# Patient Record
Sex: Male | Born: 2000 | Race: Black or African American | Hispanic: No | Marital: Single
Health system: Southern US, Community
[De-identification: ages and names within clinical notes are randomized; demographics above are authoritative.]

## PROBLEM LIST (undated history)

## (undated) DIAGNOSIS — H409 Unspecified glaucoma: Secondary | ICD-10-CM

## (undated) HISTORY — DX: Unspecified glaucoma: H40.9

## (undated) HISTORY — PX: EYE SURGERY: SHX253

---
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
## 2019-11-11 ENCOUNTER — Other Ambulatory Visit: Payer: Self-pay

## 2019-11-11 ENCOUNTER — Ambulatory Visit (INDEPENDENT_AMBULATORY_CARE_PROVIDER_SITE_OTHER): Payer: 59 | Admitting: Adult Health Nurse Practitioner

## 2019-11-11 ENCOUNTER — Encounter: Payer: Self-pay | Admitting: Adult Health Nurse Practitioner

## 2019-11-11 ENCOUNTER — Ambulatory Visit (INDEPENDENT_AMBULATORY_CARE_PROVIDER_SITE_OTHER): Payer: 59

## 2019-11-11 VITALS — BP 107/64 | HR 78 | Temp 98.2°F | Ht 67.0 in | Wt 143.0 lb

## 2019-11-11 DIAGNOSIS — R0689 Other abnormalities of breathing: Secondary | ICD-10-CM

## 2019-11-11 DIAGNOSIS — R000 Tachycardia, unspecified: Secondary | ICD-10-CM | POA: Insufficient documentation
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
## 2019-11-11 NOTE — Patient Instructions (Signed)
° ° ° °  If you have lab work done today you will be contacted with your lab results within the next 2 weeks.  If you have not heard from us then please contact us. The fastest way to get your results is to register for My Chart. ° ° °IF you received an x-ray today, you will receive an invoice from [facility]. Please contact [facility] at [phone] with questions or concerns regarding your invoice.  ° °IF you received labwork today, you will receive an invoice from [facility]. Please contact [facility] at [phone] with questions or concerns regarding your invoice.  ° °Our billing staff will not be able to assist you with questions regarding bills from these companies. ° °You will be contacted with the lab results as soon as they are available. The fastest way to get your results is to activate your My Chart account. Instructions are located on the last page of this paperwork. If you have not heard from us regarding the results in 2 weeks, please contact this office. °  ° ° ° °

## 2019-11-12 LAB — CMP14+EGFR
ALT: 11 IU/L (ref 0–44)
AST: 16 IU/L (ref 0–40)
Albumin/Globulin Ratio: 1.8 (ref 1.2–2.2)
Albumin: 4.4 g/dL (ref 4.1–5.2)
Alkaline Phosphatase: 97 IU/L (ref 39–117)
BUN/Creatinine Ratio: 6 — ABNORMAL LOW (ref 9–20)
BUN: 7 mg/dL (ref 6–20)
Bilirubin Total: 0.3 mg/dL (ref 0.0–1.2)
CO2: 25 mmol/L (ref 20–29)
Calcium: 9.1 mg/dL (ref 8.7–10.2)
Chloride: 105 mmol/L (ref 96–106)
Creatinine, Ser: 1.08 mg/dL (ref 0.76–1.27)
GFR calc Af Amer: 114 mL/min/{1.73_m2} (ref >59)
GFR calc non Af Amer: 98 mL/min/{1.73_m2} (ref >59)
Globulin, Total: 2.4 g/dL (ref 1.5–4.5)
Glucose: 86 mg/dL (ref 65–99)
Potassium: 4.1 mmol/L (ref 3.5–5.2)
Sodium: 143 mmol/L (ref 134–144)
Total Protein: 6.8 g/dL (ref 6.0–8.5)

## 2019-11-12 LAB — THYROID PANEL WITH TSH
Free Thyroxine Index: 1.9 (ref 1.2–4.9)
T3 Uptake Ratio: 31 % (ref 24–39)
T4, Total: 6.2 ug/dL (ref 4.5–12.0)
TSH: 2.85 u[IU]/mL (ref 0.450–4.500)

## 2019-11-25 ENCOUNTER — Encounter: Payer: 59 | Admitting: Adult Health Nurse Practitioner

## 2019-12-01 ENCOUNTER — Encounter: Payer: Self-pay | Admitting: Adult Health Nurse Practitioner

## 2020-03-13 ENCOUNTER — Other Ambulatory Visit: Payer: Self-pay

## 2020-03-13 ENCOUNTER — Encounter (HOSPITAL_BASED_OUTPATIENT_CLINIC_OR_DEPARTMENT_OTHER): Payer: Self-pay | Admitting: *Deleted

## 2020-03-13 DIAGNOSIS — M79604 Pain in right leg: Secondary | ICD-10-CM | POA: Diagnosis not present

## 2020-03-13 DIAGNOSIS — Z5321 Procedure and treatment not carried out due to patient leaving prior to being seen by health care provider: Secondary | ICD-10-CM | POA: Insufficient documentation

## 2020-03-13 DIAGNOSIS — M545 Low back pain: Secondary | ICD-10-CM | POA: Insufficient documentation

## 2020-03-13 MED ORDER — ACETAMINOPHEN 325 MG PO TABS
650.0000 mg | ORAL_TABLET | Freq: Once | ORAL | Status: AC | PRN
  Administered 2020-03-13: 650 mg via ORAL
  Filled 2020-03-13: qty 2

## 2020-03-13 NOTE — ED Triage Notes (Addendum)
Pt reports low back pain, onset today while walking. Reports pain radiates into right leg. Reports he initially had stomach pain but now the pain is mostly in his back. Denies injury. Pt reports he received his first covid vaccine last wednesday

## 2020-03-14 ENCOUNTER — Emergency Department (HOSPITAL_BASED_OUTPATIENT_CLINIC_OR_DEPARTMENT_OTHER)
Admission: EM | Admit: 2020-03-14 | Discharge: 2020-03-14 | Disposition: A | Discharge status: Home / Self Care | Payer: No Typology Code available for payment source | Attending: Emergency Medicine | Admitting: Emergency Medicine

## 2020-03-14 NOTE — ED Notes (Signed)
Called pt   No response from lobby  

## 2020-03-14 NOTE — ED Notes (Signed)
Called for third time  No response 

## 2020-03-14 NOTE — ED Notes (Signed)
Pt called. No response.

## 2021-04-08 ENCOUNTER — Emergency Department (HOSPITAL_COMMUNITY): Payer: No Typology Code available for payment source

## 2021-04-08 ENCOUNTER — Other Ambulatory Visit: Payer: Self-pay

## 2021-04-08 ENCOUNTER — Emergency Department (HOSPITAL_COMMUNITY)
Admission: EM | Admit: 2021-04-08 | Discharge: 2021-04-08 | Disposition: A | Discharge status: Home / Self Care | Payer: No Typology Code available for payment source | Attending: Emergency Medicine | Admitting: Emergency Medicine

## 2021-04-08 ENCOUNTER — Encounter (HOSPITAL_COMMUNITY): Payer: Self-pay | Admitting: Radiology

## 2021-04-08 DIAGNOSIS — W3400XA Accidental discharge from unspecified firearms or gun, initial encounter: Secondary | ICD-10-CM

## 2021-04-08 DIAGNOSIS — T1490XA Injury, unspecified, initial encounter: Secondary | ICD-10-CM

## 2021-04-08 DIAGNOSIS — R079 Chest pain, unspecified: Secondary | ICD-10-CM | POA: Diagnosis not present

## 2021-04-08 DIAGNOSIS — S40252A Superficial foreign body of left shoulder, initial encounter: Secondary | ICD-10-CM | POA: Insufficient documentation

## 2021-04-08 DIAGNOSIS — S41012A Laceration without foreign body of left shoulder, initial encounter: Secondary | ICD-10-CM | POA: Diagnosis not present

## 2021-04-08 DIAGNOSIS — S4992XA Unspecified injury of left shoulder and upper arm, initial encounter: Secondary | ICD-10-CM | POA: Diagnosis present

## 2021-04-08 DIAGNOSIS — Z23 Encounter for immunization: Secondary | ICD-10-CM | POA: Diagnosis not present

## 2021-04-08 LAB — CBC
HCT: 43 % (ref 39.0–52.0)
Hemoglobin: 13.7 g/dL (ref 13.0–17.0)
MCH: 31.3 pg (ref 26.0–34.0)
MCHC: 31.9 g/dL (ref 30.0–36.0)
MCV: 98.2 fL (ref 80.0–100.0)
Platelets: 208 10*3/uL (ref 150–400)
RBC: 4.38 MIL/uL (ref 4.22–5.81)
RDW: 11.8 % (ref 11.5–15.5)
WBC: 6.8 10*3/uL (ref 4.0–10.5)
nRBC: 0 % (ref 0.0–0.2)

## 2021-04-08 MED ORDER — IOHEXOL 350 MG/ML SOLN
50.0000 mL | Freq: Once | INTRAVENOUS | Status: AC | PRN
  Administered 2021-04-08: 50 mL via INTRAVENOUS

## 2021-04-08 MED ORDER — TETANUS-DIPHTH-ACELL PERTUSSIS 5-2.5-18.5 LF-MCG/0.5 IM SUSY
0.5000 mL | PREFILLED_SYRINGE | Freq: Once | INTRAMUSCULAR | Status: AC
  Administered 2021-04-08: 0.5 mL via INTRAMUSCULAR

## 2021-04-08 MED ORDER — LIDOCAINE-EPINEPHRINE (PF) 2 %-1:200000 IJ SOLN
10.0000 mL | Freq: Once | INTRAMUSCULAR | Status: AC
  Administered 2021-04-08: 10 mL
  Filled 2021-04-08: qty 20

## 2021-04-08 MED ORDER — FENTANYL CITRATE PF 50 MCG/ML IJ SOSY
PREFILLED_SYRINGE | INTRAMUSCULAR | Status: AC
  Administered 2021-04-08: 50 ug via INTRAVENOUS
  Filled 2021-04-08: qty 1
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
## 2021-04-09 ENCOUNTER — Encounter (HOSPITAL_BASED_OUTPATIENT_CLINIC_OR_DEPARTMENT_OTHER): Payer: Self-pay | Admitting: *Deleted
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
